# Patient Record
Sex: Female | Born: 1985 | Race: White | Hispanic: No | Marital: Single | Smoking: Never smoker
Health system: Southern US, Community
[De-identification: ages and names within clinical notes are randomized; demographics above are authoritative.]

## PROBLEM LIST (undated history)

## (undated) DIAGNOSIS — F99 Mental disorder, not otherwise specified: Secondary | ICD-10-CM

## (undated) DIAGNOSIS — F79 Unspecified intellectual disabilities: Secondary | ICD-10-CM

## (undated) HISTORY — PX: COSMETIC SURGERY: SHX468

---

## 2005-05-21 ENCOUNTER — Ambulatory Visit (HOSPITAL_COMMUNITY): Admission: RE | Admit: 2005-05-21 | Discharge: 2005-05-21 | Payer: Self-pay | Admitting: *Deleted

## 2011-05-08 ENCOUNTER — Emergency Department (HOSPITAL_COMMUNITY)
Admission: EM | Admit: 2011-05-08 | Discharge: 2011-05-08 | Disposition: A | Discharge status: Home / Self Care | Payer: Self-pay

## 2012-01-23 ENCOUNTER — Emergency Department (HOSPITAL_COMMUNITY): Payer: 59

## 2012-01-23 ENCOUNTER — Encounter (HOSPITAL_COMMUNITY): Payer: Self-pay | Admitting: *Deleted

## 2012-01-23 ENCOUNTER — Emergency Department (HOSPITAL_COMMUNITY)
Admission: EM | Admit: 2012-01-23 | Discharge: 2012-01-23 | Disposition: A | Discharge status: Home / Self Care | Payer: 59 | Attending: Emergency Medicine | Admitting: Emergency Medicine

## 2012-01-23 DIAGNOSIS — K8020 Calculus of gallbladder without cholecystitis without obstruction: Secondary | ICD-10-CM | POA: Insufficient documentation

## 2012-01-23 DIAGNOSIS — R109 Unspecified abdominal pain: Secondary | ICD-10-CM | POA: Insufficient documentation

## 2012-01-23 DIAGNOSIS — R079 Chest pain, unspecified: Secondary | ICD-10-CM | POA: Insufficient documentation

## 2012-01-23 HISTORY — DX: Mental disorder, not otherwise specified: F99

## 2012-01-23 LAB — CBC
HCT: 43.7 % (ref 36.0–46.0)
Hemoglobin: 14 g/dL (ref 12.0–15.0)
MCV: 90.5 fL (ref 78.0–100.0)
Platelets: 364 10*3/uL (ref 150–400)
RBC: 4.83 MIL/uL (ref 3.87–5.11)
WBC: 9.3 10*3/uL (ref 4.0–10.5)

## 2012-01-23 LAB — COMPREHENSIVE METABOLIC PANEL
ALT: 16 U/L (ref 0–35)
Albumin: 3.7 g/dL (ref 3.5–5.2)
CO2: 26 mEq/L (ref 19–32)
Calcium: 9.9 mg/dL (ref 8.4–10.5)
GFR calc Af Amer: >90 mL/min (ref >90)

## 2012-01-23 LAB — DIFFERENTIAL
Basophils Absolute: 0 10*3/uL (ref 0.0–0.1)
Basophils Relative: 0 % (ref 0–1)
Eosinophils Absolute: 0.9 10*3/uL — ABNORMAL HIGH (ref 0.0–0.7)
Lymphocytes Relative: 29 % (ref 12–46)
Lymphs Abs: 2.7 10*3/uL (ref 0.7–4.0)
Monocytes Absolute: 0.7 10*3/uL (ref 0.1–1.0)
Monocytes Relative: 8 % (ref 3–12)

## 2012-01-23 MED ORDER — IOHEXOL 300 MG/ML  SOLN
100.0000 mL | Freq: Once | INTRAMUSCULAR | Status: AC | PRN
  Administered 2012-01-23: 100 mL via INTRAVENOUS

## 2012-01-23 MED ORDER — GI COCKTAIL ~~LOC~~
30.0000 mL | Freq: Once | ORAL | Status: AC
  Administered 2012-01-23: 30 mL via ORAL
  Filled 2012-01-23: qty 30

## 2012-01-23 MED ORDER — MORPHINE SULFATE 4 MG/ML IJ SOLN
4.0000 mg | Freq: Once | INTRAMUSCULAR | Status: AC
  Administered 2012-01-23: 4 mg via INTRAVENOUS
  Filled 2012-01-23: qty 1

## 2012-01-23 MED ORDER — IOHEXOL 300 MG/ML  SOLN
40.0000 mL | Freq: Once | INTRAMUSCULAR | Status: AC | PRN
  Administered 2012-01-23: 40 mL via ORAL

## 2012-01-23 NOTE — ED Notes (Signed)
Dad leaving at this time to go home, will return in 10 minutes.

## 2012-01-23 NOTE — Discharge Instructions (Signed)
Make an appointment with the surgeon to discuss whether her gallbladder should be removed on an elective basis. Return to the ED if her pain is getting worse.  Abdominal Pain Abdominal pain can be caused by many things. Your caregiver decides the seriousness of your pain by an examination and possibly blood tests and X-rays. Many cases can be observed and treated at home. Most abdominal pain is not caused by a disease and will probably improve without treatment. However, in many cases, more time must pass before a clear cause of the pain can be found. Before that point, it may not be known if you need more testing, or if hospitalization or surgery is needed. HOME CARE INSTRUCTIONS   Do not take laxatives unless directed by your caregiver.   Take pain medicine only as directed by your caregiver.   Only take over-the-counter or prescription medicines for pain, discomfort, or fever as directed by your caregiver.   Try a clear liquid diet (broth, tea, or water) for as long as directed by your caregiver. Slowly move to a bland diet as tolerated.  SEEK IMMEDIATE MEDICAL CARE IF:   The pain does not go away.   You have a fever.   You keep throwing up (vomiting).   The pain is felt only in portions of the abdomen. Pain in the right side could possibly be appendicitis. In an adult, pain in the left lower portion of the abdomen could be colitis or diverticulitis.   You pass bloody or black tarry stools.  MAKE SURE YOU:   Understand these instructions.   Will watch your condition.   Will get help right away if you are not doing well or get worse.  Document Released: 06/02/2005 Document Revised: 08/12/2011 Document Reviewed: 04/10/2008 [facility] Patient Information 2012 [place], [place].  Cholelithiasis Cholelithiasis (also called gallstones) is a form of gallbladder disease where gallstones form in your gallbladder. The gallbladder is a non-essential organ that stores bile made in the liver,  which helps digest fats. Gallstones begin as small crystals and slowly grow into stones. Gallstone pain occurs when the gallbladder spasms, and a gallstone is blocking the duct. Pain can also occur when a stone passes out of the duct.  Women are more likely to develop gallstones than men. Other factors that increase the risk of gallbladder disease are:  Having multiple pregnancies. Physicians sometimes advise removing diseased gallbladders before future pregnancies.   Obesity.   Diets heavy in fried foods and fat.   Increasing age (older than 40).   Prolonged use of medications containing female hormones.   Diabetes mellitus.   Rapid weight loss.   Family history of gallstones (heredity).  SYMPTOMS  Feeling sick to your stomach (nauseous).   Abdominal pain.   Yellowing of the skin (jaundice).   Sudden pain. It may persist from several minutes to several hours.   Worsening pain with deep breathing or when jarred.   Fever.   Tenderness to the touch.  In some cases, when gallstones do not move into the bile duct, people have no pain or symptoms. These are called "silent" gallstones. TREATMENT In severe cases, emergency surgery may be required. HOME CARE INSTRUCTIONS   Only take over-the-counter or prescription medicines for pain, discomfort, or fever as directed by your caregiver.   Follow a low-fat diet until seen again. Fat causes the gallbladder to contract, which can result in pain.   Follow up as instructed. Attacks are almost always recurrent and surgery is usually required  for permanent treatment.  SEEK IMMEDIATE MEDICAL CARE IF:   Your pain increases and is not controlled by medications.   You have an oral temperature above 102 F (38.9 C), not controlled by medication.   You develop nausea and vomiting.  MAKE SURE YOU:   Understand these instructions.   Will watch your condition.   Will get help right away if you are not doing well or get worse.    Document Released: 08/19/2005 Document Revised: 08/12/2011 Document Reviewed: 10/22/2010 [facility] Patient Information 2012 [place], [place].

## 2012-01-23 NOTE — ED Notes (Signed)
Pt completed one bottle of contrast. Assisted by father, ct-tech notified.

## 2012-01-23 NOTE — ED Notes (Signed)
Pt drinking contrast, assisted by father

## 2012-01-23 NOTE — ED Notes (Signed)
Caregiver states pt not eating as much as normal. Pt complaining of stomach hurts & moving up to her chest. Pt was given pepto PTA w/ some relief.

## 2012-01-23 NOTE — ED Notes (Signed)
Pt complaining of upper abdominal pain.

## 2012-01-23 NOTE — ED Provider Notes (Addendum)
History     CSN: [id number]  Arrival date & time 01/23/12  0459   First MD Initiated Contact with Patient 01/23/12 0500      Chief Complaint  Patient presents with  . Abdominal Pain    (Consider location/radiation/quality/duration/timing/severity/associated sxs/prior treatment) HPI Comments: Pt is a 26 y/o female with special needs according to caregiver who has no surgical history, presents with lower chest pain adn upper abd pain that started yesterday, was intermittent, gradually worsening, associated with decreased appetite but no vomiting, fever, diarrhea, rashes or other c/o.  She is not fothcoming with information as she has some "special needs".  Sx are persistent.  Patient is a 26 y.o. female presenting with abdominal pain. The history is provided by the patient and a parent.  Abdominal Pain The primary symptoms of the illness include abdominal pain.    Past Medical History  Diagnosis Date  . Mental disorder     Past Surgical History  Procedure Date  . Cosmetic surgery     No family history on file.  History  Substance Use Topics  . Smoking status: Never Smoker   . Smokeless tobacco: Not on file  . Alcohol Use: No    OB History    Grav Para Term Preterm Abortions TAB SAB Ect Mult Living                  Review of Systems  Gastrointestinal: Positive for abdominal pain.  All other systems reviewed and are negative.    Allergies  Review of patient's allergies indicates no known allergies.  Home Medications  No current outpatient prescriptions on file.  BP 146/75  Pulse 70  Temp(Src) 97.6 F (36.4 C) (Oral)  Resp 20  Ht 5\' 6"  (1.676 m)  Wt 150 lb (68.04 kg)  BMI 24.21 kg/m2  SpO2 100%  LMP 01/17/2012  Physical Exam  Nursing note and vitals reviewed. Constitutional: She appears well-developed and well-nourished.       Uncomfortable appearing  HENT:  Head: Normocephalic and atraumatic.  Mouth/Throat: Oropharynx is clear and moist. No  oropharyngeal exudate.  Eyes: Conjunctivae and EOM are normal. Pupils are equal, round, and reactive to light. Right eye exhibits no discharge. Left eye exhibits no discharge. No scleral icterus.  Neck: Normal range of motion. Neck supple. No JVD present. No thyromegaly present.  Cardiovascular: Normal rate, regular rhythm and intact distal pulses.  Exam reveals no gallop and no friction rub.   Murmur ( soft systolic) heard. Pulmonary/Chest: Effort normal and breath sounds normal. No respiratory distress. She has no wheezes. She has no rales.  Abdominal: Soft. Bowel sounds are normal. She exhibits no distension and no mass. There is tenderness ( epigastric, RUQ and mid abd ttp with some guarding, no lower abd ttp, non peritoneal).  Musculoskeletal: Normal range of motion. She exhibits no edema and no tenderness.  Lymphadenopathy:    She has no cervical adenopathy.  Neurological: She is alert. Coordination normal.       Ambulates without difficulty  Skin: Skin is warm and dry. No rash noted. No erythema.  Psychiatric: She has a normal mood and affect. Her behavior is normal.    ED Course  Procedures (including critical care time)  ED ECG REPORT   Date: 01/23/2012   Rate: 64  Rhythm: normal sinus rhythm  QRS Axis: normal  Intervals: normal  ST/T Wave abnormalities: normal  Conduction Disutrbances:nonspecific intraventricular conduction delay  Narrative Interpretation:   Old EKG Reviewed: none available  Labs Reviewed  CBC  DIFFERENTIAL  LIPASE, BLOOD  COMPREHENSIVE METABOLIC PANEL  URINALYSIS, WITH MICROSCOPIC   No results found.   No diagnosis found.    MDM  Pt seen and examined - evaluate with labs, pain medicines, imaging with Acute abd series.  Vs nml, no fever, tachycardia.  Has both upper abd and lower CP - ECG to r/o pericarditis.  Pt has improved after medicines, has been given GI cocktail and Morphine, no vomiting, drinking contrast at this time.  CT pending.   Change of shift - care signed out to Dr. Bobette Mo, MD 01/23/12 [phone]  CT scan shows evidence of cholelithiasis. It iss not clear whether gallstones are responsible for her pain. However, given her limited ability to communicate, I think she should be considered for elective cholecystectomy. I discussed this with the patient's father.  Results for orders placed during the hospital encounter of 01/23/12  CBC      Component Value Range   WBC 9.3  4.0 - 10.5 (K/uL)   RBC 4.83  3.87 - 5.11 (MIL/uL)   Hemoglobin 14.0  12.0 - 15.0 (g/dL)   HCT 11.9  14.7 - 82.9 (%)   MCV 90.5  78.0 - 100.0 (fL)   MCH 29.0  26.0 - 34.0 (pg)   MCHC 32.0  30.0 - 36.0 (g/dL)   RDW 56.2  13.0 - 86.5 (%)   Platelets 364  150 - 400 (K/uL)  DIFFERENTIAL      Component Value Range   Neutrophils Relative 53  43 - 77 (%)   Neutro Abs 5.0  1.7 - 7.7 (K/uL)   Lymphocytes Relative 29  12 - 46 (%)   Lymphs Abs 2.7  0.7 - 4.0 (K/uL)   Monocytes Relative 8  3 - 12 (%)   Monocytes Absolute 0.7  0.1 - 1.0 (K/uL)   Eosinophils Relative 10 (*) 0 - 5 (%)   Eosinophils Absolute 0.9 (*) 0.0 - 0.7 (K/uL)   Basophils Relative 0  0 - 1 (%)   Basophils Absolute 0.0  0.0 - 0.1 (K/uL)  LIPASE, BLOOD      Component Value Range   Lipase 26  11 - 59 (U/L)  COMPREHENSIVE METABOLIC PANEL      Component Value Range   Sodium 140  135 - 145 (mEq/L)   Potassium 3.0 (*) 3.5 - 5.1 (mEq/L)   Chloride 101  96 - 112 (mEq/L)   CO2 26  19 - 32 (mEq/L)   Glucose, Bld 141 (*) 70 - 99 (mg/dL)   BUN 8  6 - 23 (mg/dL)   Creatinine, Ser 7.84  0.50 - 1.10 (mg/dL)   Calcium 9.9  8.4 - 69.6 (mg/dL)   Total Protein 7.5  6.0 - 8.3 (g/dL)   Albumin 3.7  3.5 - 5.2 (g/dL)   AST 18  0 - 37 (U/L)   ALT 16  0 - 35 (U/L)   Alkaline Phosphatase 101  39 - 117 (U/L)   Total Bilirubin 0.2 (*) 0.3 - 1.2 (mg/dL)   GFR calc non Af Amer >90  >90 (mL/min)   GFR calc Af Amer >90  >90 (mL/min)   Ct Abdomen Pelvis W Contrast  01/23/2012   *RADIOLOGY REPORT*  Clinical Data: Mid upper abdominal pain  CT ABDOMEN AND PELVIS WITH CONTRAST  Technique:  Multidetector CT imaging of the abdomen and pelvis was performed following the standard protocol during bolus administration of intravenous contrast.  Contrast: OMNIPAQUE IOHEXOL 300 MG/ML  SOLN  Comparison: Abdominal radiographic series - 01/23/2012  Findings:  Normal hepatic contour.  No discrete hepatic lesions.  Multiple gallstones are seen within an otherwise normal-appearing gallbladder.  No definite gallbladder wall thickening or pericholecystic fluid.  No definite intra or extrahepatic biliary duct dilatation.  No ascites.  There is symmetric enhancement of the bilateral kidneys.  No urinary obstruction.  No discrete renal lesions.  No definite renal stones on the post contrast examination.  Normal appearance of the bilateral adrenal glands, pancreas and spleen.  Ingested enteric contrast extends to the level of the distal small bowel.  No evidence of enteric obstruction.  The sigmoid colon is noted to be slightly redundant.  Several diverticuli are noted within the transverse colon.  No evidence of diverticulitis. Normal appearance of the appendix.  No pneumoperitoneum, pneumatosis or portal venous gas.  Normal caliber of the abdominal aorta.  The major branch vessels of the abdominal aorta are patent.  Scattered shoddy mesenteric lymph nodes including dominant mesenteric node in the right lower quadrant measuring 8 mm in short axis diameter (image 47, series 2) not enlarged by CT criteria and likely reactive in etiology.  No retroperitoneal, pelvic or inguinal lymphadenopathy.  Pelvic organs are normal for age.  Minimal amount of fluid within the endometrial canal, likely physiologic.  No free fluid within the pelvis.  Limited visualization of the lower thorax demonstrates minimal basilar ground-glass opacities, right greater than left, favored to represent atelectasis.  No discrete focal  airspace opacities.  No pleural effusion.  Normal heart size.  No pericardial effusion.  No acute or aggressive osseous abnormalities.  IMPRESSION: 1.  Cholelithiasis without definite evidence of cholecystitis. Further evaluation may be obtained with a right upper quadrant abominal ultrasound as clinically indicated.  Otherwise, no explanation for patient's abdominal pain.  2.  Colonic diverticulosis without evidence of diverticulitis.  Original Report Authenticated By: Waynard Reeds, M.D.   Dg Abd Acute W/chest  01/23/2012  *RADIOLOGY REPORT*  Clinical Data: Upper abdominal pain  ACUTE ABDOMEN SERIES (ABDOMEN 2 VIEW & CHEST 1 VIEW)  Comparison: None.  Findings: Lungs are clear.  Cardiomediastinal contours are within normal limits.  Nonobstructive bowel gas pattern.  Organ outlines normal where seen.  No free intraperitoneal air.  No acute osseous finding.  IMPRESSION: Nonobstructive bowel gas pattern.  Original Report Authenticated By: Waneta Martins, M.D.      Dione Booze, MD 01/23/12 [id number]

## 2012-02-01 NOTE — H&P (Signed)
  NTS SOAP Note  Vital Signs:  Vitals as of: 02/01/2012: Systolic 121: Diastolic 73: Heart Rate 65: Temp 29F: Height 59ft 3.5in: Weight 162Lbs 0 Ounces: Pain Level 8: BMI 28  BMI : 28.25 kg/m2  Subjective: This 26 Years 3 Months old Female presents for of    ABDOMINAL PAIN : ,Has been having increasing upper abdominal pain over the past few weeks.  No nausea, vomiting, fever, chills, jaundice.  History obtained from mother as patient has mental deficiency.  Seen in ER, found to have cholelithiasis, diverticulosis.  Review of Symptoms:  Constitutional:unremarkable   Head:unremarkable    Eyes:unremarkable   Nose/Mouth/Throat:unremarkable Cardiovascular:  unremarkable   Respiratory:unremarkable   Gastrointestinal:  see above   Genitourinary:unremarkable     Musculoskeletal:unremarkable   Skin:unremarkable Hematolgic/Lymphatic:unremarkable     Allergic/Immunologic:unremarkable     Past Medical History:    Reviewed   Past Medical History  Surgical History: none Medical Problems: mental slowness Allergies: nkda  Medications: none   Social History:Reviewed  Social History  Preferred Language: English ([place]) Race:  White Ethnicity: Not Hispanic / Latino Age: 26 Years 5 Months Marital Status:  S Alcohol:  No Recreational drug(s):  No   Smoking Status: Never smoker reviewed on 02/01/2012  Family History:  Reviewed   Family History              Father: HTN    Objective Information: General:unremarkable   Head:Atraumatic; no masses; no abnormalities   No scleral icterus Heart:  RRR, no murmur Lungs:    CTA bilaterally, no wheezes, rhonchi, rales.  Breathing unlabored. Abdomen:Soft, slightly tender to palpation right upper quadrant, ND, no HSM, no masses.  Assessment:Biliary colic, cholelithiasis  Diagnosis &amp; Procedure: DiagnosisCode: 574.20, ProcedureCode: 16109,    Plan:i  discussed situation with mother.  She believes it is the gallbladder as she had similar symptoms and had hers removed.  Scheduled for laparoscopic cholecystectomy on 02/04/12.  She understands that this may or may not help her, but will help with any diagnostic workup in the future given patient is a poor historian.   Patient Education:Alternative treatments to surgery were discussed with patient (and family).  Risks and benefits  of procedure were fully explained to the patient (and family) who gave informed consent. Patient/family questions were addressed.  Follow-up:Pending Surgery

## 2012-02-02 ENCOUNTER — Encounter (HOSPITAL_COMMUNITY)
Admission: RE | Admit: 2012-02-02 | Discharge: 2012-02-02 | Disposition: A | Discharge status: Home / Self Care | Payer: 59 | Source: Ambulatory Visit | Attending: General Surgery | Admitting: General Surgery

## 2012-02-02 ENCOUNTER — Encounter (HOSPITAL_COMMUNITY): Payer: Self-pay

## 2012-02-02 ENCOUNTER — Encounter (HOSPITAL_COMMUNITY): Payer: Self-pay | Admitting: Pharmacy Technician

## 2012-02-02 LAB — BASIC METABOLIC PANEL
BUN: 11 mg/dL (ref 6–23)
CO2: 26 mEq/L (ref 19–32)
Chloride: 108 mEq/L (ref 96–112)
Creatinine, Ser: 0.68 mg/dL (ref 0.50–1.10)
GFR calc non Af Amer: >90 mL/min (ref >90)

## 2012-02-02 LAB — HCG, SERUM, QUALITATIVE: Preg, Serum: NEGATIVE

## 2012-02-02 LAB — SURGICAL PCR SCREEN: Staphylococcus aureus: NEGATIVE

## 2012-02-02 NOTE — Patient Instructions (Addendum)
PATIENT INSTRUCTIONS POST-ANESTHESIA  IMMEDIATELY FOLLOWING SURGERY:  Do not drive or operate machinery for the first twenty four hours after surgery.  Do not make any important decisions for twenty four hours after surgery or while taking narcotic pain medications or sedatives.  If you develop intractable nausea and vomiting or a severe headache please notify your doctor immediately.  FOLLOW-UP:  Please make an appointment with your surgeon as instructed. You do not need to follow up with anesthesia unless specifically instructed to do so.  WOUND CARE INSTRUCTIONS (if applicable):  Keep a dry clean dressing on the anesthesia/puncture wound site if there is drainage.  Once the wound has quit draining you may leave it open to air.  Generally you should leave the bandage intact for twenty four hours unless there is drainage.  If the epidural site drains for more than 36-48 hours please call the anesthesia department.  QUESTIONS?:  Please feel free to call your physician or the hospital operator if you have any questions, and they will be happy to assist you.     20 Karina Rogers  02/02/2012   Your procedure is scheduled on:   02/04/2012   Report to [facility] at  615  AM.  Call this number if you have problems the morning of surgery: [phone]   Remember:   Do not eat food:After Midnight.  May have clear liquids:until Midnight .    Take these medicines the morning of surgery with A SIP OF WATER:  none   Do not wear jewelry, make-up or nail polish.  Do not wear lotions, powders, or perfumes. You may wear deodorant.  Do not shave 48 hours prior to surgery. Men may shave face and neck.  Do not bring valuables to the hospital.  Contacts, dentures or bridgework may not be worn into surgery.  Leave suitcase in the car. After surgery it may be brought to your room.  For patients admitted to the hospital, checkout time is 11:00 AM the day of discharge.   Patients discharged the day of surgery  will not be allowed to drive home.  Name and phone number of your driver: family  Special Instructions: CHG Shower Use Special Wash: 1/2 bottle night before surgery and 1/2 bottle morning of surgery.   Please read over the following fact sheets that you were given: Pain Booklet, MRSA Information, Surgical Site Infection Prevention, Anesthesia Post-op Instructions and Care and Recovery After Surgery Laparoscopic Cholecystectomy Laparoscopic cholecystectomy is surgery to remove the gallbladder. The gallbladder is located slightly to the right of center in the abdomen, behind the liver. It is a concentrating and storage sac for the bile produced in the liver. Bile aids in the digestion and absorption of fats. Gallbladder disease (cholecystitis) is an inflammation of your gallbladder. This condition is usually caused by a buildup of gallstones (cholelithiasis) in your gallbladder. Gallstones can block the flow of bile, resulting in inflammation and pain. In severe cases, emergency surgery may be required. When emergency surgery is not required, you will have time to prepare for the procedure. Laparoscopic surgery is an alternative to open surgery. Laparoscopic surgery usually has a shorter recovery time. Your common bile duct may also need to be examined and explored. Your caregiver will discuss this with you if he or she feels this should be done. If stones are found in the common bile duct, they may be removed. LET YOUR CAREGIVER KNOW ABOUT:  Allergies to food or medicine.   Medicines taken, including  vitamins, herbs, eyedrops, over-the-counter medicines, and creams.   Use of steroids (by mouth or creams).   Previous problems with anesthetics or numbing medicines.   History of bleeding problems or blood clots.   Previous surgery.   Other health problems, including diabetes and kidney problems.   Possibility of pregnancy, if this applies.  RISKS AND COMPLICATIONS All surgery is associated with  risks. Some problems that may occur following this procedure include:  Infection.   Damage to the common bile duct, nerves, arteries, veins, or other internal organs such as the stomach or intestines.   Bleeding.   A stone may remain in the common bile duct.  BEFORE THE PROCEDURE  Do not take aspirin for 3 days prior to surgery or blood thinners for 1 week prior to surgery.   Do not eat or drink anything after midnight the night before surgery.   Let your caregiver know if you develop a cold or other infectious problem prior to surgery.   You should be present 60 minutes before the procedure or as directed.  PROCEDURE  You will be given medicine that makes you sleep (general anesthetic). When you are asleep, your surgeon will make several small cuts (incisions) in your abdomen. One of these incisions is used to insert a small, lighted scope (laparoscope) into the abdomen. The laparoscope helps the surgeon see into your abdomen. Carbon dioxide gas will be pumped into your abdomen. The gas allows more room for the surgeon to perform your surgery. Other operating instruments are inserted through the other incisions. Laparoscopic procedures may not be appropriate when:  There is major scarring from previous surgery.   The gallbladder is extremely inflamed.   There are bleeding disorders or unexpected cirrhosis of the liver.   A pregnancy is near term.   Other conditions make the laparoscopic procedure impossible.  If your surgeon feels it is not safe to continue with a laparoscopic procedure, he or she will perform an open abdominal procedure. In this case, the surgeon will make an incision to open the abdomen. This gives the surgeon a larger view and field to work within. This may allow the surgeon to perform procedures that sometimes cannot be performed with a laparoscope alone. Open surgery has a longer recovery time. AFTER THE PROCEDURE  You will be taken to the recovery area where  a nurse will watch and check your progress.   You may be allowed to go home the same day.   Do not resume physical activities until directed by your caregiver.   You may resume a normal diet and activities as directed.  Document Released: 08/23/2005 Document Revised: 08/12/2011 Document Reviewed: 02/05/2011 [facility] Patient Information 2012 [place], [place].

## 2012-02-04 ENCOUNTER — Encounter (HOSPITAL_COMMUNITY)
Admission: RE | Disposition: A | Discharge status: Home / Self Care | Payer: Self-pay | Source: Ambulatory Visit | Attending: General Surgery

## 2012-02-04 ENCOUNTER — Encounter (HOSPITAL_COMMUNITY): Payer: Self-pay | Admitting: Anesthesiology

## 2012-02-04 ENCOUNTER — Ambulatory Visit (HOSPITAL_COMMUNITY): Payer: 59 | Admitting: Anesthesiology

## 2012-02-04 ENCOUNTER — Ambulatory Visit (HOSPITAL_COMMUNITY)
Admission: RE | Admit: 2012-02-04 | Discharge: 2012-02-04 | Disposition: A | Discharge status: Home / Self Care | Payer: 59 | Source: Ambulatory Visit | Attending: General Surgery | Admitting: General Surgery

## 2012-02-04 ENCOUNTER — Encounter (HOSPITAL_COMMUNITY): Payer: Self-pay | Admitting: *Deleted

## 2012-02-04 DIAGNOSIS — K8020 Calculus of gallbladder without cholecystitis without obstruction: Secondary | ICD-10-CM | POA: Insufficient documentation

## 2012-02-04 HISTORY — PX: CHOLECYSTECTOMY: SHX55

## 2012-02-04 SURGERY — LAPAROSCOPIC CHOLECYSTECTOMY
Anesthesia: General | Site: Abdomen | Wound class: Clean Contaminated

## 2012-02-04 MED ORDER — ROCURONIUM BROMIDE 50 MG/5ML IV SOLN
INTRAVENOUS | Status: AC
  Filled 2012-02-04: qty 1

## 2012-02-04 MED ORDER — LACTATED RINGERS IV SOLN
INTRAVENOUS | Status: DC
  Administered 2012-02-04 (×2): 1000 mL via INTRAVENOUS

## 2012-02-04 MED ORDER — SODIUM CHLORIDE 0.9 % IR SOLN
Status: DC | PRN
  Administered 2012-02-04: 1000 mL

## 2012-02-04 MED ORDER — GLYCOPYRROLATE 0.2 MG/ML IJ SOLN
INTRAMUSCULAR | Status: AC
  Filled 2012-02-04: qty 1

## 2012-02-04 MED ORDER — NEOSTIGMINE METHYLSULFATE 1 MG/ML IJ SOLN
INTRAMUSCULAR | Status: AC
  Filled 2012-02-04: qty 10

## 2012-02-04 MED ORDER — MIDAZOLAM HCL 2 MG/2ML IJ SOLN
1.0000 mg | INTRAMUSCULAR | Status: DC | PRN
  Administered 2012-02-04: 2 mg via INTRAVENOUS

## 2012-02-04 MED ORDER — MIDAZOLAM HCL 2 MG/2ML IJ SOLN
INTRAMUSCULAR | Status: AC
  Administered 2012-02-04: 2 mg via INTRAVENOUS
  Filled 2012-02-04: qty 2

## 2012-02-04 MED ORDER — ONDANSETRON HCL 4 MG/2ML IJ SOLN
INTRAMUSCULAR | Status: AC
  Administered 2012-02-04: 4 mg via INTRAVENOUS
  Filled 2012-02-04: qty 2

## 2012-02-04 MED ORDER — PROPOFOL 10 MG/ML IV EMUL
INTRAVENOUS | Status: DC | PRN
  Administered 2012-02-04: 150 mg via INTRAVENOUS

## 2012-02-04 MED ORDER — CEFAZOLIN SODIUM-DEXTROSE 2-3 GM-% IV SOLR
2.0000 g | INTRAVENOUS | Status: AC
  Administered 2012-02-04: 2 g via INTRAVENOUS

## 2012-02-04 MED ORDER — LACTATED RINGERS IV SOLN: INTRAVENOUS | Status: DC

## 2012-02-04 MED ORDER — NEOSTIGMINE METHYLSULFATE 1 MG/ML IJ SOLN
INTRAMUSCULAR | Status: DC | PRN
  Administered 2012-02-04: 2 mg via INTRAVENOUS

## 2012-02-04 MED ORDER — ROCURONIUM BROMIDE 100 MG/10ML IV SOLN
INTRAVENOUS | Status: DC | PRN
  Administered 2012-02-04: 25 mg via INTRAVENOUS
  Administered 2012-02-04: 5 mg via INTRAVENOUS

## 2012-02-04 MED ORDER — CHLORHEXIDINE GLUCONATE 4 % EX LIQD
1.0000 "application " | Freq: Once | CUTANEOUS | Status: DC
  Filled 2012-02-04: qty 15

## 2012-02-04 MED ORDER — KETOROLAC TROMETHAMINE 30 MG/ML IJ SOLN: 30.0000 mg | Freq: Once | INTRAMUSCULAR | Status: DC

## 2012-02-04 MED ORDER — LIDOCAINE HCL (PF) 1 % IJ SOLN
INTRAMUSCULAR | Status: AC
  Filled 2012-02-04: qty 5

## 2012-02-04 MED ORDER — PROPOFOL 10 MG/ML IV EMUL
INTRAVENOUS | Status: AC
  Filled 2012-02-04: qty 20

## 2012-02-04 MED ORDER — LIDOCAINE HCL (CARDIAC) 10 MG/ML IV SOLN
INTRAVENOUS | Status: DC | PRN
  Administered 2012-02-04: 10 mg via INTRAVENOUS

## 2012-02-04 MED ORDER — ACETAMINOPHEN 325 MG PO TABS: 325.0000 mg | ORAL_TABLET | ORAL | Status: DC | PRN

## 2012-02-04 MED ORDER — HEMOSTATIC AGENTS (NO CHARGE) OPTIME
TOPICAL | Status: DC | PRN
  Administered 2012-02-04: 1 via TOPICAL

## 2012-02-04 MED ORDER — GLYCOPYRROLATE 0.2 MG/ML IJ SOLN
INTRAMUSCULAR | Status: DC | PRN
  Administered 2012-02-04: 0.4 mg via INTRAVENOUS

## 2012-02-04 MED ORDER — GLYCOPYRROLATE 0.2 MG/ML IJ SOLN
0.2000 mg | Freq: Once | INTRAMUSCULAR | Status: AC
  Administered 2012-02-04: 0.2 mg via INTRAVENOUS

## 2012-02-04 MED ORDER — GLYCOPYRROLATE 0.2 MG/ML IJ SOLN
INTRAMUSCULAR | Status: AC
  Administered 2012-02-04: 0.2 mg via INTRAVENOUS
  Filled 2012-02-04: qty 1

## 2012-02-04 MED ORDER — BUPIVACAINE HCL (PF) 0.5 % IJ SOLN
INTRAMUSCULAR | Status: AC
  Filled 2012-02-04: qty 30

## 2012-02-04 MED ORDER — FENTANYL CITRATE 0.05 MG/ML IJ SOLN
INTRAMUSCULAR | Status: DC | PRN
  Administered 2012-02-04: 50 ug via INTRAVENOUS
  Administered 2012-02-04: 25 ug via INTRAVENOUS
  Administered 2012-02-04: 50 ug via INTRAVENOUS
  Administered 2012-02-04: 25 ug via INTRAVENOUS
  Administered 2012-02-04: 50 ug via INTRAVENOUS

## 2012-02-04 MED ORDER — CEFAZOLIN SODIUM-DEXTROSE 2-3 GM-% IV SOLR
INTRAVENOUS | Status: AC
  Filled 2012-02-04: qty 50

## 2012-02-04 MED ORDER — ONDANSETRON HCL 4 MG/2ML IJ SOLN: 4.0000 mg | Freq: Once | INTRAMUSCULAR | Status: DC | PRN

## 2012-02-04 MED ORDER — BUPIVACAINE HCL 0.5 % IJ SOLN
INTRAMUSCULAR | Status: DC | PRN
  Administered 2012-02-04: 10 mL

## 2012-02-04 MED ORDER — FENTANYL CITRATE 0.05 MG/ML IJ SOLN
INTRAMUSCULAR | Status: AC
  Administered 2012-02-04: 50 ug via INTRAVENOUS
  Filled 2012-02-04: qty 2

## 2012-02-04 MED ORDER — ONDANSETRON HCL 4 MG/2ML IJ SOLN
4.0000 mg | Freq: Once | INTRAMUSCULAR | Status: AC
  Administered 2012-02-04: 4 mg via INTRAVENOUS

## 2012-02-04 MED ORDER — HYDROCODONE-ACETAMINOPHEN 5-325 MG PO TABS
1.0000 | ORAL_TABLET | Freq: Four times a day (QID) | ORAL | Status: AC | PRN
Start: 2012-02-04 — End: 2012-02-14

## 2012-02-04 MED ORDER — FENTANYL CITRATE 0.05 MG/ML IJ SOLN
INTRAMUSCULAR | Status: AC
  Filled 2012-02-04: qty 5

## 2012-02-04 MED ORDER — FENTANYL CITRATE 0.05 MG/ML IJ SOLN
25.0000 ug | INTRAMUSCULAR | Status: DC | PRN
  Administered 2012-02-04 (×2): 50 ug via INTRAVENOUS

## 2012-02-04 SURGICAL SUPPLY — 37 items
ADH SKN CLS APL DERMABOND .7 (GAUZE/BANDAGES/DRESSINGS) ×1
APPLIER CLIP ROT 10 11.4 M/L (STAPLE) ×2
APR CLP MED LRG 11.4X10 (STAPLE) ×1
BAG HAMPER (MISCELLANEOUS) ×2 IMPLANT
BAG SPEC RTRVL LRG 6X4 10 (ENDOMECHANICALS) ×1
CLIP APPLIE ROT 10 11.4 M/L (STAPLE) ×1 IMPLANT
CLOTH BEACON ORANGE TIMEOUT ST (SAFETY) ×2 IMPLANT
COVER LIGHT HANDLE STERIS (MISCELLANEOUS) ×4 IMPLANT
DECANTER SPIKE VIAL GLASS SM (MISCELLANEOUS) ×2 IMPLANT
DERMABOND ADVANCED (GAUZE/BANDAGES/DRESSINGS) ×1
DERMABOND ADVANCED .7 DNX12 (GAUZE/BANDAGES/DRESSINGS) IMPLANT
DURAPREP 26ML APPLICATOR (WOUND CARE) ×2 IMPLANT
ELECT REM PT RETURN 9FT ADLT (ELECTROSURGICAL) ×2
ELECTRODE REM PT RTRN 9FT ADLT (ELECTROSURGICAL) ×1 IMPLANT
FILTER SMOKE EVAC LAPAROSHD (FILTER) ×2 IMPLANT
FORMALIN 10 PREFIL 120ML (MISCELLANEOUS) ×2 IMPLANT
GLOVE BIO SURGEON STRL SZ7.5 (GLOVE) ×2 IMPLANT
GLOVE BIOGEL PI IND STRL 7.0 (GLOVE) IMPLANT
GLOVE BIOGEL PI INDICATOR 7.0 (GLOVE) ×2
GLOVE ECLIPSE 6.5 STRL STRAW (GLOVE) ×2 IMPLANT
GOWN STRL REIN XL XLG (GOWN DISPOSABLE) ×3 IMPLANT
HEMOSTAT SNOW SURGICEL 2X4 (HEMOSTASIS) ×1 IMPLANT
INST SET LAPROSCOPIC AP (KITS) ×2 IMPLANT
KIT ROOM TURNOVER APOR (KITS) ×2 IMPLANT
KIT TROCAR LAP CHOLE (TROCAR) ×2 IMPLANT
MANIFOLD NEPTUNE II (INSTRUMENTS) ×2 IMPLANT
NS IRRIG 1000ML POUR BTL (IV SOLUTION) ×2 IMPLANT
PACK LAP CHOLE LZT030E (CUSTOM PROCEDURE TRAY) ×2 IMPLANT
PAD ARMBOARD 7.5X6 YLW CONV (MISCELLANEOUS) ×2 IMPLANT
POUCH SPECIMEN RETRIEVAL 10MM (ENDOMECHANICALS) ×2 IMPLANT
SET BASIN LINEN APH (SET/KITS/TRAYS/PACK) ×2 IMPLANT
SPONGE GAUZE 2X2 8PLY STRL LF (GAUZE/BANDAGES/DRESSINGS) ×4 IMPLANT
STAPLER VISISTAT (STAPLE) ×1 IMPLANT
SUT VIC AB 4-0 PS2 27 (SUTURE) ×2 IMPLANT
SUT VICRYL 0 UR6 27IN ABS (SUTURE) ×2 IMPLANT
WARMER LAPAROSCOPE (MISCELLANEOUS) ×2 IMPLANT
YANKAUER SUCT 12FT TUBE ARGYLE (SUCTIONS) ×2 IMPLANT

## 2012-02-04 NOTE — Interval H&P Note (Signed)
History and Physical Interval Note:  02/04/2012 7:16 AM  Karina Rogers  has presented today for surgery, with the diagnosis of cholecystitis, cholelithiasis  The various methods of treatment have been discussed with the patient and family. After consideration of risks, benefits and other options for treatment, the patient has consented to  Procedure(s) (LRB): LAPAROSCOPIC CHOLECYSTECTOMY (N/A) as a surgical intervention .  The patients' history has been reviewed, patient examined, no change in status, stable for surgery.  I have reviewed the patients' chart and labs.  Questions were answered to the patient's satisfaction.     Franky Macho A

## 2012-02-04 NOTE — Anesthesia Postprocedure Evaluation (Signed)
Anesthesia Post Note  Patient: Karina Rogers  Procedure(s) Performed: Procedure(s) (LRB): LAPAROSCOPIC CHOLECYSTECTOMY (N/A)  Anesthesia type: General  Patient location: PACU  Post pain: Pain level controlled  Post assessment: Post-op Vital signs reviewed, Patient's Cardiovascular Status Stable, Respiratory Function Stable, Patent Airway, No signs of Nausea or vomiting and Pain level controlled  Last Vitals:  Filed Vitals:   02/04/12 0856  BP: 133/68  Pulse: 107  Temp: 36.5 C  Resp: 12    Post vital signs: Reviewed and stable  Level of consciousness: awake and alert   Complications: No apparent anesthesia complications

## 2012-02-04 NOTE — Anesthesia Preprocedure Evaluation (Addendum)
Anesthesia Evaluation  Patient identified by MRN, date of birth, ID band Patient awake    Reviewed: Allergy & Precautions, H&P , NPO status , Patient's Chart, lab work & pertinent test results  Airway Mallampati: I TM Distance: >3 FB Neck ROM: Full    Dental No notable dental hx.    Pulmonary neg pulmonary ROS,          Cardiovascular negative cardio ROS  Rate:Normal     Neuro/Psych PSYCHIATRIC DISORDERS negative neurological ROS     GI/Hepatic negative GI ROS, Neg liver ROS,   Endo/Other  negative endocrine ROS  Renal/GU negative Renal ROS     Musculoskeletal negative musculoskeletal ROS (+)   Abdominal   Peds  Hematology negative hematology ROS (+)   Anesthesia Other Findings   Reproductive/Obstetrics negative OB ROS                          Anesthesia Physical Anesthesia Plan  ASA: II  Anesthesia Plan: General   Post-op Pain Management:    Induction: Intravenous  Airway Management Planned: Oral ETT  Additional Equipment:   Intra-op Plan:   Post-operative Plan: Extubation in OR  Informed Consent: I have reviewed the patients History and Physical, chart, labs and discussed the procedure including the risks, benefits and alternatives for the proposed anesthesia with the patient or authorized representative who has indicated his/her understanding and acceptance.   Dental advisory given  Plan Discussed with: CRNA  Anesthesia Plan Comments:         Anesthesia Quick Evaluation

## 2012-02-04 NOTE — Transfer of Care (Signed)
Immediate Anesthesia Transfer of Care Note  Patient: Karina Rogers  Procedure(s) Performed: Procedure(s) (LRB): LAPAROSCOPIC CHOLECYSTECTOMY (N/A)  Patient Location: PACU  Anesthesia Type: General  Level of Consciousness: awake  Airway & Oxygen Therapy: Patient Spontanous Breathing and non-rebreather face mask  Post-op Assessment: Report given to PACU RN, Post -op Vital signs reviewed and stable and Patient moving all extremities  Post vital signs: Reviewed and stable  Complications: No apparent anesthesia complications

## 2012-02-04 NOTE — Op Note (Signed)
Patient:  Karina Rogers  DOB:  Dec 29, 1985  MRN:  [id number]   Preop Diagnosis:  Biliary colic, cholelithiasis  Postop Diagnosis:  Same  Procedure:  Laparoscopic cholecystectomy  Surgeon:  Franky Macho, M.D.  Anes:  General endotracheal  Indications:  Patient is a 26 year old white female presents with biliary colic secondary to cholelithiasis. The risks and benefits of the procedure including bleeding, infection, hepatobiliary injury, the possibly of an open procedure were fully explained to the patient's parents, gave informed consent the patient as the patient is mentally deficient.  Procedure note:  Patient is placed the supine position. After induction of general endotracheal anesthesia, the abdomen was prepped and draped using usual sterile technique with DuraPrep. Surgical site confirmation was performed.  A supraumbilical incision was made down to the fascia. A Veress needle was introduced into the abdominal cavity and confirmation of placement was done using the saline drop test. The abdomen was then insufflated to 16 mm mercury pressure. An 11 mm trocar was introduced into the abdominal cavity under direct visualization the difficulty. The patient was placed in reverse Trendelenburg position additional 11 mm trocar was placed the epigastric region and 5 mm trochars were placed the right upper quadrant and right flank regions. The liver was inspected and noted to be within normal limits. Gallbladder was retracted superiorly and laterally. Dissection was begun around the infundibulum of the gallbladder. The cystic duct was first identified. Its junction to the infundibulum was fully identified. Endoclips were placed proximally distally on the cystic duct and cystic duct was divided. This is likewise done cystic artery. The gallbladder was then freed away from the gallbladder fossa using Bovie electrocautery. The gallbladder was delivered through the epigastric trocar site using an Endo  Catch bag. The gallbladder fossa was inspected and no abnormal bleeding or bile leakage was noted. Surgicel is placed the gallbladder fossa. All fluid and air were then evacuated from the abdominal cavity prior to removal of the trochars.  All wounds were gave normal saline. All wounds were injected with 0.5% Sensorcaine. The supraumbilical fascia was reapproximated using an 0 Vicryl interrupted suture. All skin incisions were closed using a 4-0 Vicryl subcuticular suture. Dermabond was applied.  All tape and needle counts were correct at the end of the procedure. Patient was extubated in the operating room and went back recovery room awake in stable condition.  Complications:  None  EBL:  Minimal  Specimen:  Gallbladder

## 2012-02-04 NOTE — Discharge Instructions (Signed)
Laparoscopic Cholecystectomy Care After Refer to this sheet in the next few weeks. These instructions provide you with information on caring for yourself after your procedure. Your caregiver may also give you more specific instructions. Your treatment has been planned according to current medical practices, but problems sometimes occur. Call your caregiver if you have any problems or questions after your procedure. HOME CARE INSTRUCTIONS   Change bandages (dressings) as directed by your caregiver.   Keep the wound dry and clean. The wound may be washed gently with soap and water. Gently blot or dab the area dry.   Do not take baths or use swimming pools or hot tubs for 10 days, or as instructed by your caregiver.   Only take over-the-counter or prescription medicines for pain, discomfort, or fever as directed by your caregiver.   Continue your normal diet as directed by your caregiver.   Do not lift anything heavier than 25 pounds (11.5 kg), or as directed by your caregiver.   Do not play contact sports for 1 week, or as directed by your caregiver.  SEEK MEDICAL CARE IF:   There is redness, swelling, or increasing pain in the wound.   You notice yellowish-white fluid (pus) coming from the wound.   There is drainage from the wound that lasts longer than 1 day.   There is a bad smell coming from the wound or dressing.   The surgical cut (incision) breaks open.  SEEK IMMEDIATE MEDICAL CARE IF:   You develop a rash.   You have difficulty breathing.   You develop chest pain.   You develop any reaction or side effects to medicines given.   You have a fever.   You have increasing pain in the shoulders (shoulder strap areas).   You have dizzy episodes or faint while standing.   You develop severe abdominal pain.   You feel sick to your stomach (nauseous) or throw up (vomit) and this lasts for more than 1 day.  MAKE SURE YOU:   Understand these instructions.   Will watch  your condition.   Will get help right away if you are not doing well or get worse.  Document Released: 08/23/2005 Document Revised: 08/12/2011 Document Reviewed: 02/05/2011 [employer] Patient Information 2012 [employer]. 

## 2012-02-04 NOTE — Anesthesia Procedure Notes (Signed)
Procedure Name: Intubation Date/Time: 02/04/2012 8:02 AM Performed by: Franco Nones Pre-anesthesia Checklist: Patient identified, Patient being monitored, Timeout performed, Emergency Drugs available and Suction available Patient Re-evaluated:Patient Re-evaluated prior to inductionOxygen Delivery Method: Circle System Utilized Preoxygenation: Pre-oxygenation with 100% oxygen Intubation Type: IV induction Ventilation: Mask ventilation without difficulty Laryngoscope Size: Miller and 2 Grade View: Grade I Tube type: Oral Tube size: 7.0 mm Number of attempts: 1 Airway Equipment and Method: stylet Placement Confirmation: ETT inserted through vocal cords under direct vision,  positive ETCO2 and breath sounds checked- equal and bilateral Secured at: 21 cm Tube secured with: Tape Dental Injury: Teeth and Oropharynx as per pre-operative assessment

## 2012-02-09 ENCOUNTER — Encounter (HOSPITAL_COMMUNITY): Payer: Self-pay | Admitting: General Surgery

## 2013-07-02 IMAGING — CR DG ABDOMEN ACUTE W/ 1V CHEST
4 series · 4 of 4 positions shown · non-contrast
Comparison: None.

CLINICAL DATA: Upper abdominal pain

ACUTE ABDOMEN SERIES (ABDOMEN 2 VIEW & CHEST 1 VIEW)

[view not recorded (1 of 4)]
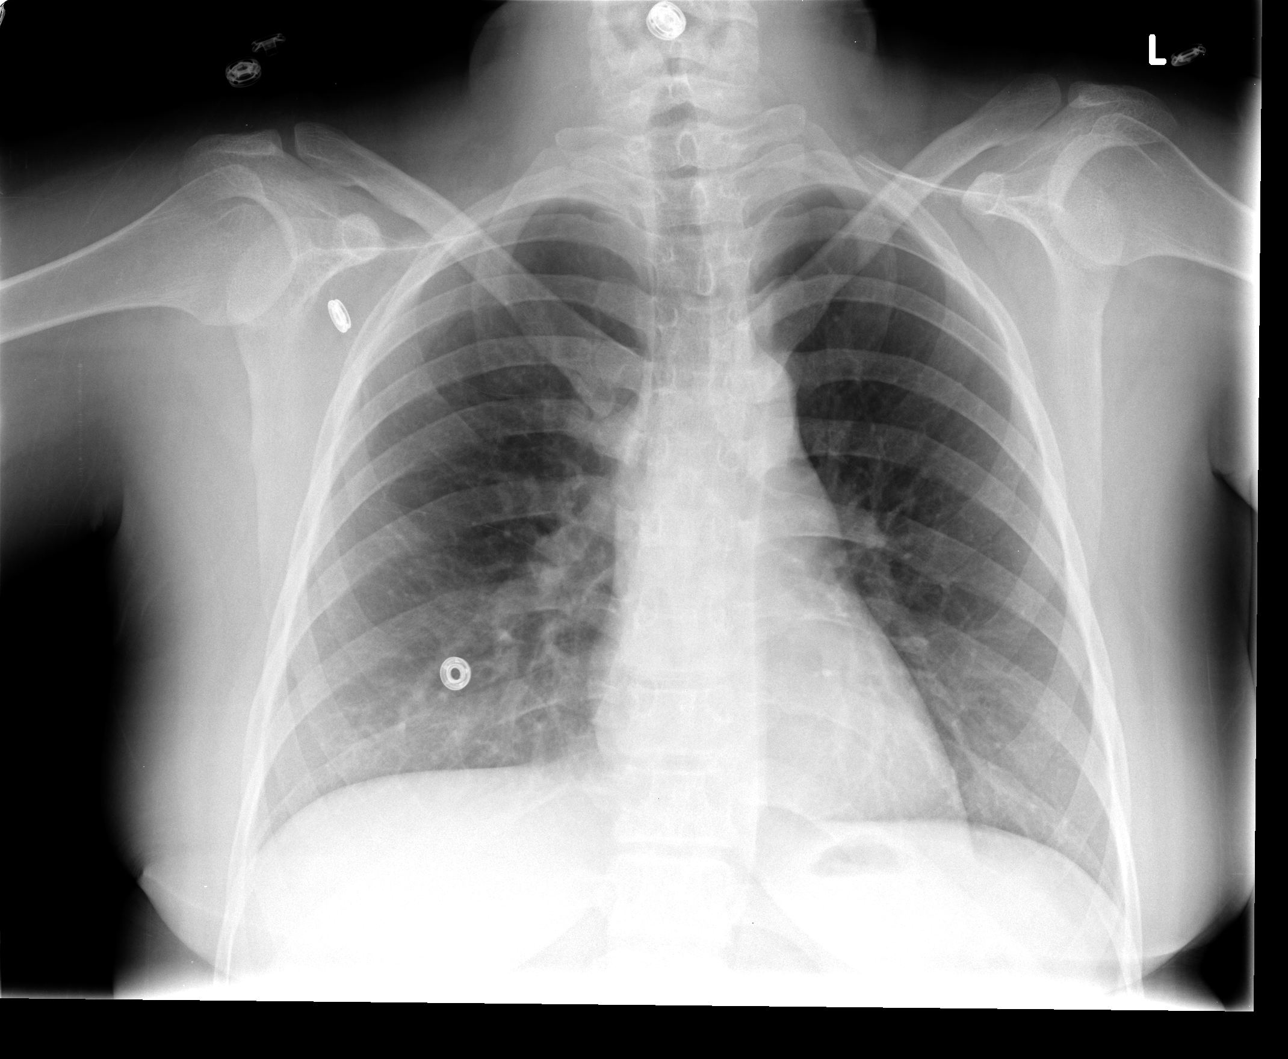

[view not recorded (2 of 4)]
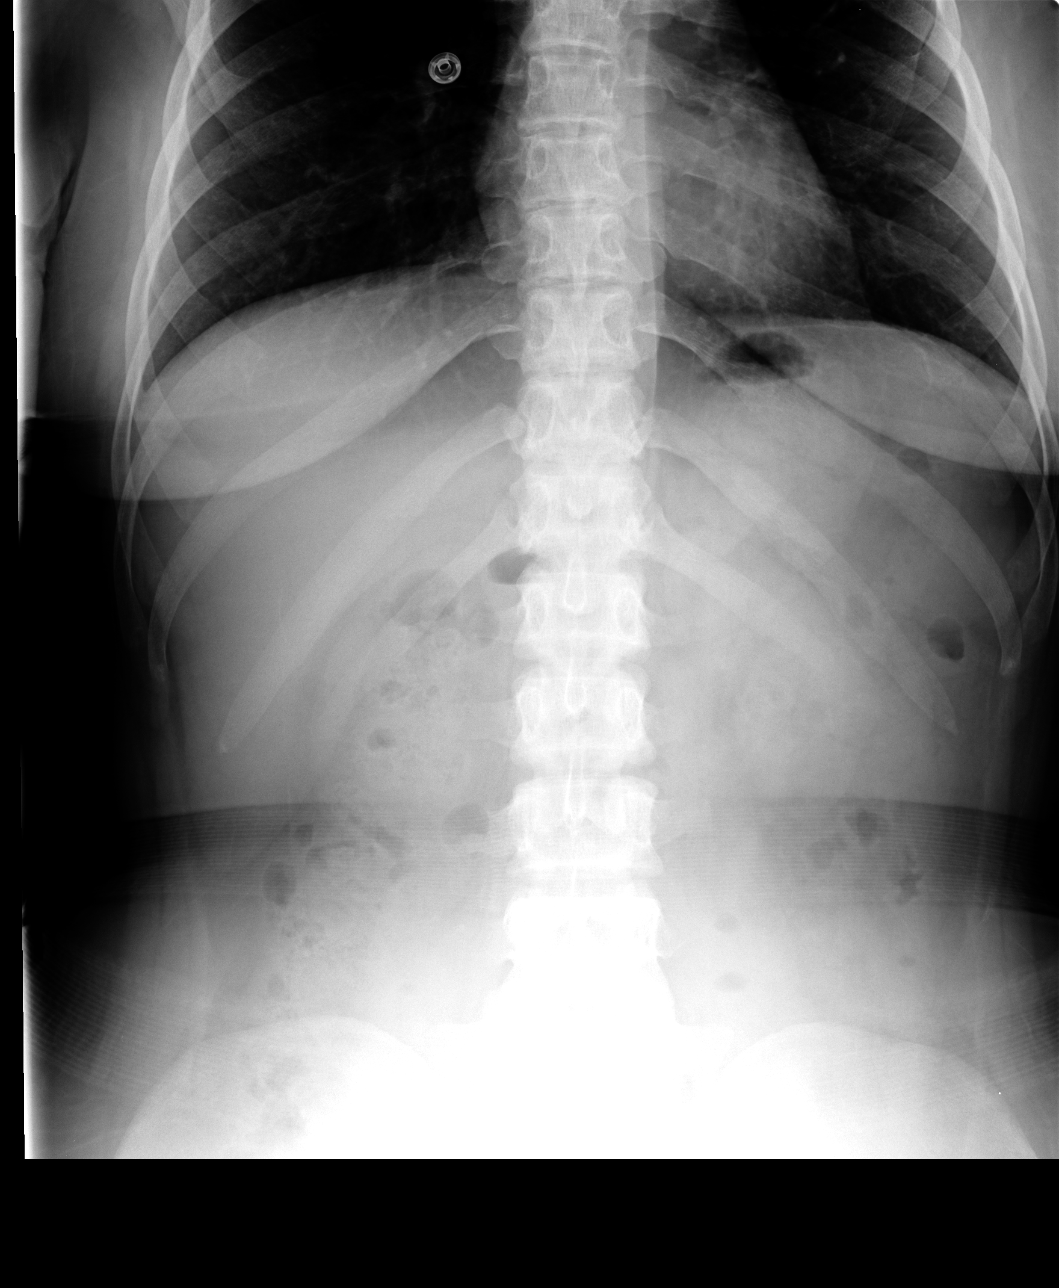

[view not recorded (3 of 4)]
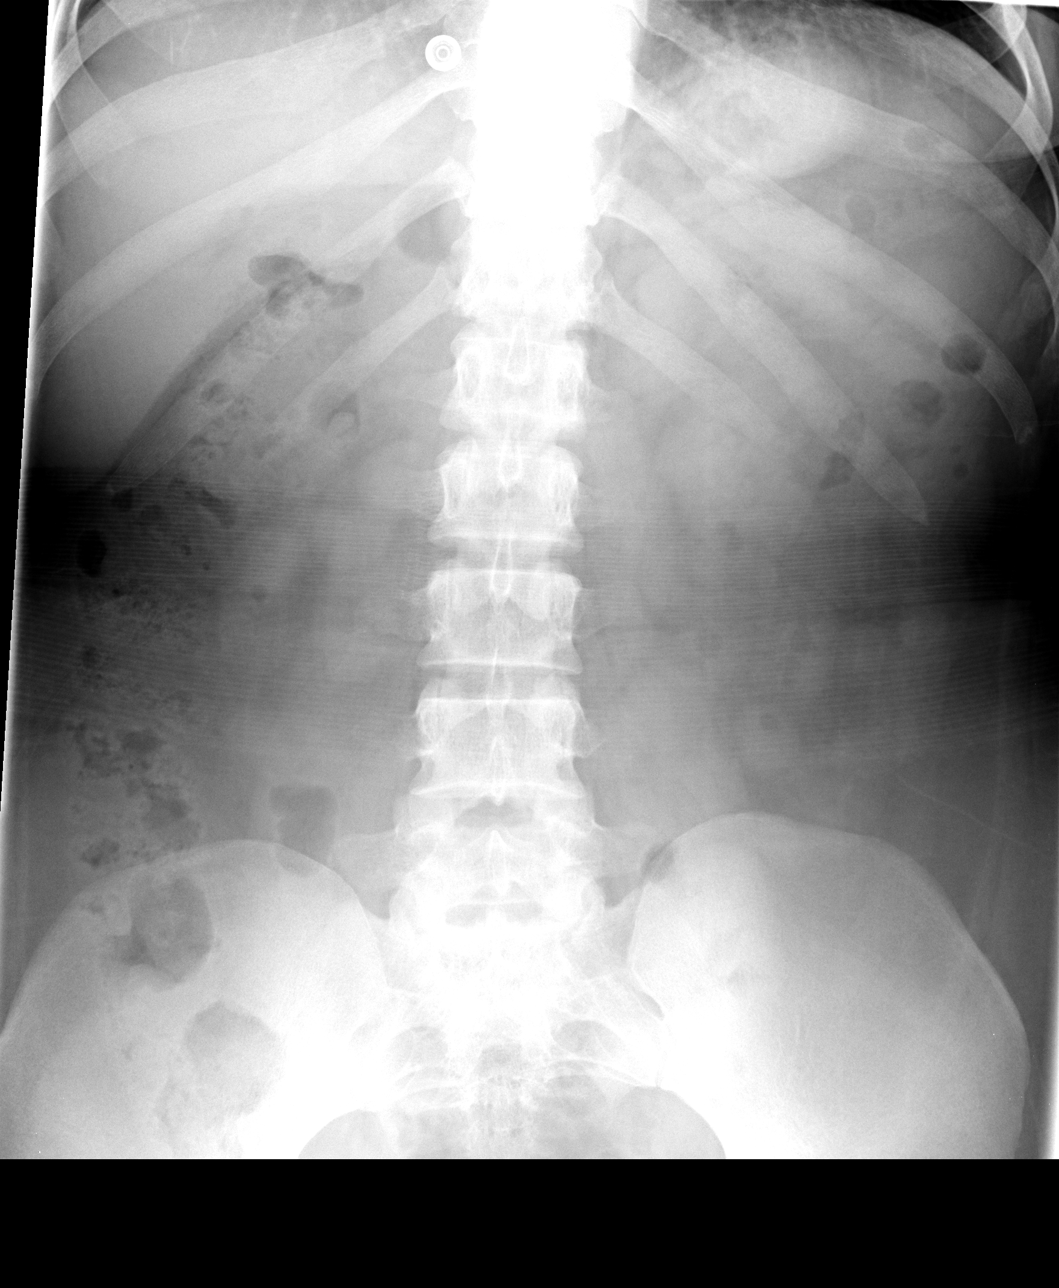

[view not recorded (4 of 4)]
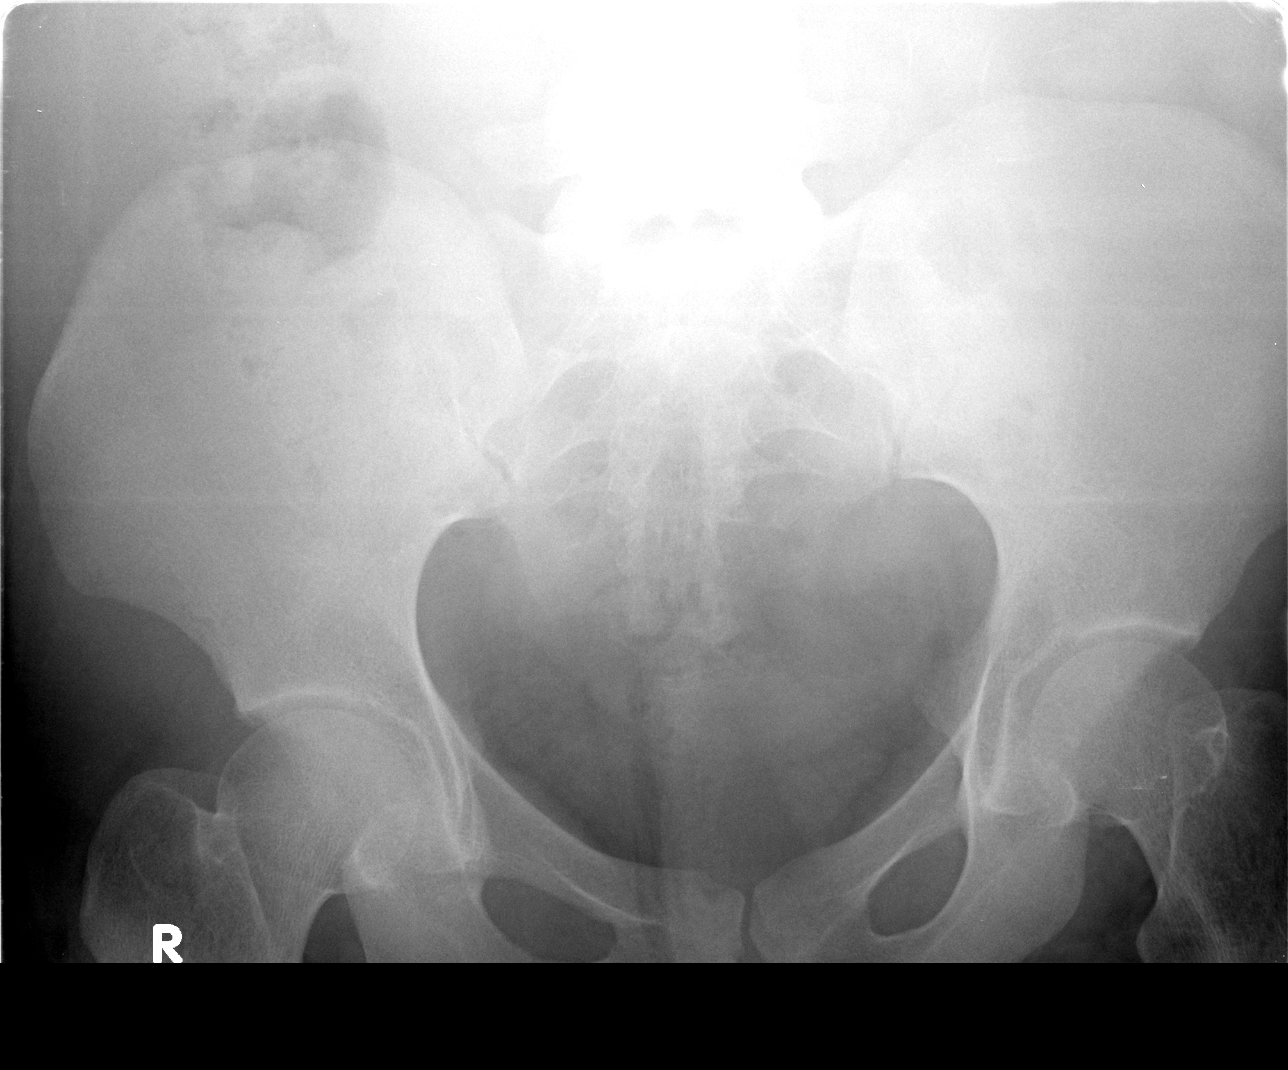

[4 of 4 positions shown; findings below may reference images not displayed]

FINDINGS: Lungs are clear.  Cardiomediastinal contours are within
normal limits.

Nonobstructive bowel gas pattern.  Organ outlines normal where
seen.  No free intraperitoneal air.  No acute osseous finding.
IMPRESSION: Nonobstructive bowel gas pattern.

## 2015-09-26 ENCOUNTER — Emergency Department (HOSPITAL_COMMUNITY): Payer: Commercial Managed Care - HMO

## 2015-09-26 ENCOUNTER — Encounter (HOSPITAL_COMMUNITY): Payer: Self-pay

## 2015-09-26 ENCOUNTER — Emergency Department (HOSPITAL_COMMUNITY)
Admission: EM | Admit: 2015-09-26 | Discharge: 2015-09-26 | Disposition: A | Discharge status: Home / Self Care | Payer: Commercial Managed Care - HMO | Attending: Emergency Medicine | Admitting: Emergency Medicine

## 2015-09-26 DIAGNOSIS — Y998 Other external cause status: Secondary | ICD-10-CM | POA: Diagnosis not present

## 2015-09-26 DIAGNOSIS — S99921A Unspecified injury of right foot, initial encounter: Secondary | ICD-10-CM | POA: Diagnosis present

## 2015-09-26 DIAGNOSIS — Z8659 Personal history of other mental and behavioral disorders: Secondary | ICD-10-CM | POA: Diagnosis not present

## 2015-09-26 DIAGNOSIS — S92531A Displaced fracture of distal phalanx of right lesser toe(s), initial encounter for closed fracture: Secondary | ICD-10-CM | POA: Insufficient documentation

## 2015-09-26 DIAGNOSIS — S92301A Fracture of unspecified metatarsal bone(s), right foot, initial encounter for closed fracture: Secondary | ICD-10-CM

## 2015-09-26 DIAGNOSIS — Y9289 Other specified places as the place of occurrence of the external cause: Secondary | ICD-10-CM | POA: Insufficient documentation

## 2015-09-26 DIAGNOSIS — Y9389 Activity, other specified: Secondary | ICD-10-CM | POA: Diagnosis not present

## 2015-09-26 DIAGNOSIS — W1839XA Other fall on same level, initial encounter: Secondary | ICD-10-CM | POA: Insufficient documentation

## 2015-09-26 HISTORY — DX: Unspecified intellectual disabilities: F79

## 2015-09-26 MED ORDER — HYDROCODONE-ACETAMINOPHEN 7.5-325 MG/15ML PO SOLN: 10.0000 mL | Freq: Four times a day (QID) | ORAL | Status: DC | PRN

## 2015-09-26 MED ORDER — HYDROCODONE-ACETAMINOPHEN 5-325 MG PO TABS: 1.0000 | ORAL_TABLET | ORAL | Status: DC | PRN

## 2015-09-26 MED ORDER — IBUPROFEN 100 MG/5ML PO SUSP
400.0000 mg | Freq: Once | ORAL | Status: AC
  Administered 2015-09-26: 400 mg via ORAL
  Filled 2015-09-26: qty 20

## 2015-09-26 NOTE — ED Notes (Signed)
Father reports pt fell yesterday and c/o pain to r foot.  No obvious bruising or deformity.

## 2015-09-26 NOTE — Discharge Instructions (Signed)
Metatarsal Fracture A metatarsal fracture is a break in a metatarsal bone. Metatarsal bones connect your toe bones to your ankle bones. CAUSES This type of fracture may be caused by:  A sudden twisting of your foot.  A fall onto your foot.  Overuse or repetitive exercise. RISK FACTORS This condition is more likely to develop in people who:  Play contact sports.  Have a bone disease.  Have a low calcium level. SYMPTOMS Symptoms of this condition include:  Pain that is worse when walking or standing.  Pain when pressing on the foot or moving the toes.  Swelling.  Bruising on the top or bottom of the foot.  A foot that appears shorter than the other one. DIAGNOSIS This condition is diagnosed with a physical exam. You may also have imaging tests, such as:  X-rays.  A CT scan.  MRI. TREATMENT Treatment for this condition depends on its severity and whether a bone has moved out of place. Treatment may involve:  Rest.  Wearing foot support such as a cast, splint, or boot for several weeks.  Using crutches.  Surgery to move bones back into the right position. Surgery is usually needed if there are many pieces of broken bone or bones that are very out of place (displaced fracture).  Physical therapy. This may be needed to help you regain full movement and strength in your foot. You will need to return to your health care provider to have X-rays taken until your bones heal. Your health care provider will look at the X-rays to make sure that your foot is healing well. HOME CARE INSTRUCTIONS  If You Have a Cast:  Do not stick anything inside the cast to scratch your skin. Doing that increases your risk of infection.  Check the skin around the cast every day. Report any concerns to your health care provider. You may put lotion on dry skin around the edges of the cast. Do not apply lotion to the skin underneath the cast.  Keep the cast clean and dry. If You Have a Splint  or a Supportive Boot:  Wear it as directed by your health care provider. Remove it only as directed by your health care provider.  Loosen it if your toes become numb and tingle, or if they turn cold and blue.  Keep it clean and dry. Bathing  Do not take baths, swim, or use a hot tub until your health care provider approves. Ask your health care provider if you can take showers. You may only be allowed to take sponge baths for bathing.  If your health care provider approves bathing and showering, cover the cast or splint with a watertight plastic bag to protect it from water. Do not let the cast or splint get wet. Managing Pain, Stiffness, and Swelling  If directed, apply ice to the injured area (if you have a splint, not a cast).  Put ice in a plastic bag.  Place a towel between your skin and the bag.  Leave the ice on for 20 minutes, 2-3 times per day.  Move your toes often to avoid stiffness and to lessen swelling.  Raise (elevate) the injured area above the level of your heart while you are sitting or lying down. Driving  Do not drive or operate heavy machinery while taking pain medicine.  Do not drive while wearing foot support on a foot that you use for driving. Activity  Return to your normal activities as directed by your health care   provider. Ask your health care provider what activities are safe for you.  Perform exercises as directed by your health care provider or physical therapist. Safety  Do not use the injured foot to support your body weight until your health care provider says that you can. Use crutches as directed by your health care provider. General Instructions  Do not put pressure on any part of the cast or splint until it is fully hardened. This may take several hours.  Do not use any tobacco products, including cigarettes, chewing tobacco, or e-cigarettes. Tobacco can delay bone healing. If you need help quitting, ask your health care  provider.  Take medicines only as directed by your health care provider.  Keep all follow-up visits as directed by your health care provider. This is important. SEEK MEDICAL CARE IF:  You have a fever.  Your cast, splint, or boot is too loose or too tight.  Your cast, splint, or boot is damaged.  Your pain medicine is not helping.  You have pain, tingling, or numbness in your foot that is not going away. SEEK IMMEDIATE MEDICAL CARE IF:  You have severe pain.  You have tingling or numbness in your foot that is getting worse.  Your foot feels cold or becomes numb.  Your foot changes color.   This information is not intended to replace advice given to you by your health care provider. Make sure you discuss any questions you have with your health care provider.   Document Released: 05/15/2002 Document Revised: 01/07/2015 Document Reviewed: 06/19/2014 Elsevier Interactive Patient Education 2016 [employer]  

## 2015-09-27 NOTE — ED Provider Notes (Signed)
CSN: [id number]     Arrival date & time 09/26/15  1223 History   First MD Initiated Contact with Patient 09/26/15 1301     Chief Complaint  Patient presents with  . Fall     (Consider location/radiation/quality/duration/timing/severity/associated sxs/prior Treatment) The history is provided by the patient and a parent. The history is limited by a developmental delay.   Karina Rogers is a 30 y.o. female with a history of MR, presenting for evaluation of persistent right foot pain and swelling since falling yesterday.  Father reports she has a habit of sitting with her right foot tucked behind her left knee.  She stood up yesterday and fell as the right foot was "asleep".  She was ambulatory after the event, although endorsed pain.  Today she has refused to stand on the foot and has increased swelling.  She denies any other areas of pain.  She has had no medicines prior to arrival.        Past Medical History  Diagnosis Date  . Mental disorder   . Mental retardation    Past Surgical History  Procedure Laterality Date  . Cosmetic surgery      growth removed from right side of face  . Cholecystectomy  02/04/2012    Procedure: LAPAROSCOPIC CHOLECYSTECTOMY;  Surgeon: Dalia Heading, MD;  Location: [facility];  Service: General;  Laterality: N/A;   Family History  Problem Relation Age of Onset  . Anesthesia problems Neg Hx   . Hypotension Neg Hx   . Malignant hyperthermia Neg Hx   . Pseudochol deficiency Neg Hx    Social History  Substance Use Topics  . Smoking status: Never Smoker   . Smokeless tobacco: None  . Alcohol Use: No   OB History    No data available     Review of Systems  Constitutional: Negative for fever.  Musculoskeletal: Positive for joint swelling and arthralgias. Negative for myalgias.  Neurological: Negative for weakness and numbness.      Allergies  Contrast media  Home Medications   Prior to Admission medications   Medication Sig Start Date End  Date Taking? Authorizing Provider  HYDROcodone-acetaminophen (HYCET) 7.5-325 mg/15 ml solution Take 10 mLs by mouth 4 (four) times daily as needed for moderate pain. 09/26/15 09/25/16  Burgess Amor, PA-C   BP 151/74 mmHg  Pulse 120  Temp(Src) 98 F (36.7 C) (Tympanic)  Resp 18  Ht  (1.676 m)  Wt 68.04 kg  BMI 24.22 kg/m2  SpO2 100%  LMP 09/19/2015 Physical Exam  Musculoskeletal:       Right foot: There is bony tenderness and swelling. There is normal capillary refill and no deformity.  ttp with edema and early bruising right dorsal distal foot.  She can move her toes, normal sensation in the toes with less than 2 sec cap refill.  Dorsalis pedal pulse normal.  No ankle, lower leg or knee pain.    ED Course  Procedures (including critical care time) Labs Review Labs Reviewed - No data to display  Imaging Review Dg Foot Complete Right  09/26/2015  CLINICAL DATA:  Pain and swelling EXAM: RIGHT FOOT COMPLETE - 3+ VIEW COMPARISON:  None. FINDINGS: Frontal, oblique, and lateral views were obtained. There are fractures of the distal second, third, and fourth metatarsal metaphysis ease with alignment near anatomic. No other fractures. No dislocation. Joint spaces appear intact. There is soft tissue swelling dorsally in the area of the metatarsals. No erosive change. IMPRESSION: Fractures  of the distal second, third, and fourth metatarsals with alignment near anatomic. Overlying dorsal soft tissue swelling. No dislocation. No appreciable arthropathic change. Electronically Signed   By: Bretta Bang III M.D.   On: 09/26/2015 14:10   I have personally reviewed and evaluated these images and lab results as part of my medical decision-making.   EKG Interpretation None      MDM   Final diagnoses:  Fracture of metatarsal of right foot, closed, initial encounter      Radiological studies were viewed, interpreted and considered during the medical decision making and disposition process.  I agree with radiologists reading.  Results were also discussed with patient. Pt was placed in cam walker.  She is not a candidate for crutches, she has a walker with seat at home which she has used this morning and can continue using to avoid weight bearing.  Elevation, ice, ibuprofen.  hycet prescribed if needed for increased pain relief (cannot swallow tabs).  Referral to Dr. Romeo Apple, father to call for appt time early next week.  In the interim, return here for any probs or concerns.     Burgess Amor, PA-C 09/27/15 [id number]  Marily Memos, MD 09/28/15 [phone]

## 2015-10-02 ENCOUNTER — Ambulatory Visit (INDEPENDENT_AMBULATORY_CARE_PROVIDER_SITE_OTHER): Payer: Medicaid Other | Admitting: Orthopedic Surgery

## 2015-10-02 VITALS — BP 115/72 | Ht 66.0 in | Wt 150.0 lb

## 2015-10-02 DIAGNOSIS — S92301A Fracture of unspecified metatarsal bone(s), right foot, initial encounter for closed fracture: Secondary | ICD-10-CM

## 2015-10-02 NOTE — Progress Notes (Signed)
Patient ID: Karina Rogers, female   DOB: 09/25/1985, 30 y.o.   MRN: [id number]  Chief Complaint  Patient presents with  . Follow-up    er follow up Rt foot fx (toes)  DOI 09/26/15    HPI Karina Rogers is a 30 y.o. female.  Fell 6 days ago at home c/o right foot pain  Pain right foot Dulle, ache Mild constant  Review of Systems Review of Systems  Swelling Neuro normal   Past Medical History  Diagnosis Date  . Mental disorder   . Mental retardation     Past Surgical History  Procedure Laterality Date  . Cosmetic surgery      growth removed from right side of face  . Cholecystectomy  02/04/2012    Procedure: LAPAROSCOPIC CHOLECYSTECTOMY;  Surgeon: Dalia Heading, MD;  Location: [facility];  Service: General;  Laterality: N/A;    Family History  Problem Relation Age of Onset  . Anesthesia problems Neg Hx   . Hypotension Neg Hx   . Malignant hyperthermia Neg Hx   . Pseudochol deficiency Neg Hx     Social History Social History  Substance Use Topics  . Smoking status: Never Smoker   . Smokeless tobacco: Not on file  . Alcohol Use: No    Allergies  Allergen Reactions  . Contrast Media [Iodinated Diagnostic Agents] Anaphylaxis    No current outpatient prescriptions on file.   No current facility-administered medications for this visit.       Physical Exam Physical Exam Blood pressure 115/72, height  (1.676 m), weight 150 lb (68.04 kg), last menstrual period 09/19/2015. Appearance, there are no abnormalities in terms of appearance the patient was well-developed and well-nourished. The grooming and hygiene were normal.  Mental status orientation, there was normal alertness and orientation Mood pleasant Ambulatory status abnormal with cam walker and rolling walker  Examination of the right foot Inspection swelling tenderness right foot 4 / 5 digits, alignment normal  Range of motion normal but pain at mtp joints  Tests for stability deferred pain   Motor strength  No atrophy Skin warm dry and intact without laceration or ulceration or erythema Neurologic examination normal sensation Vascular examination normal pulses with warm extremity and normal capillary refill  The opposite extremity normal alignment no swelling     Data Reviewed [facility]X RAY I SEE  FRACTURES OF 2,3,4 MTT  Assessment  2,3,4 metatarsal frx    Plan  Cam walker weight bearing as tolerated   xrays right foot in 6 weeks

## 2015-11-17 ENCOUNTER — Ambulatory Visit (INDEPENDENT_AMBULATORY_CARE_PROVIDER_SITE_OTHER): Payer: Commercial Managed Care - HMO | Admitting: Orthopedic Surgery

## 2015-11-17 ENCOUNTER — Ambulatory Visit (INDEPENDENT_AMBULATORY_CARE_PROVIDER_SITE_OTHER): Payer: Commercial Managed Care - HMO

## 2015-11-17 VITALS — BP 118/77 | Ht 66.0 in | Wt 150.0 lb

## 2015-11-17 DIAGNOSIS — S92301D Fracture of unspecified metatarsal bone(s), right foot, subsequent encounter for fracture with routine healing: Secondary | ICD-10-CM | POA: Diagnosis not present

## 2015-11-17 NOTE — Progress Notes (Signed)
Patient ID: Charolett BumpersStephanie L Chagnon, female   DOB: Mar 13, 1986, 30 y.o.   MRN: [id number]  Chief Complaint  Patient presents with  . Follow-up    6 week recheck on right foot fracture with xray, DOI 09-26-15.    HPI multiple metatarsal fractures right foot proximally 6 weeks ago treated with Cam Walker  ROS noncontributory  BP 118/77 mmHg  Ht 5\' 6"  (1.676 m)  Wt 150 lb (68.04 kg)  BMI 24.22 kg/m2  No tenderness foot alignment is normal  X-rays show fracture healing    ASSESSMENT AND PLAN   Remove boot fractures have healed. Return as needed

## 2015-12-30 ENCOUNTER — Ambulatory Visit: Payer: Commercial Managed Care - HMO

## 2017-03-05 IMAGING — DX DG FOOT COMPLETE 3+V*R*
3 series · 3 of 3 positions shown · non-contrast
Comparison: None.

CLINICAL DATA: Pain and swelling

EXAM:
RIGHT FOOT COMPLETE - 3+ VIEW

[foot ap]
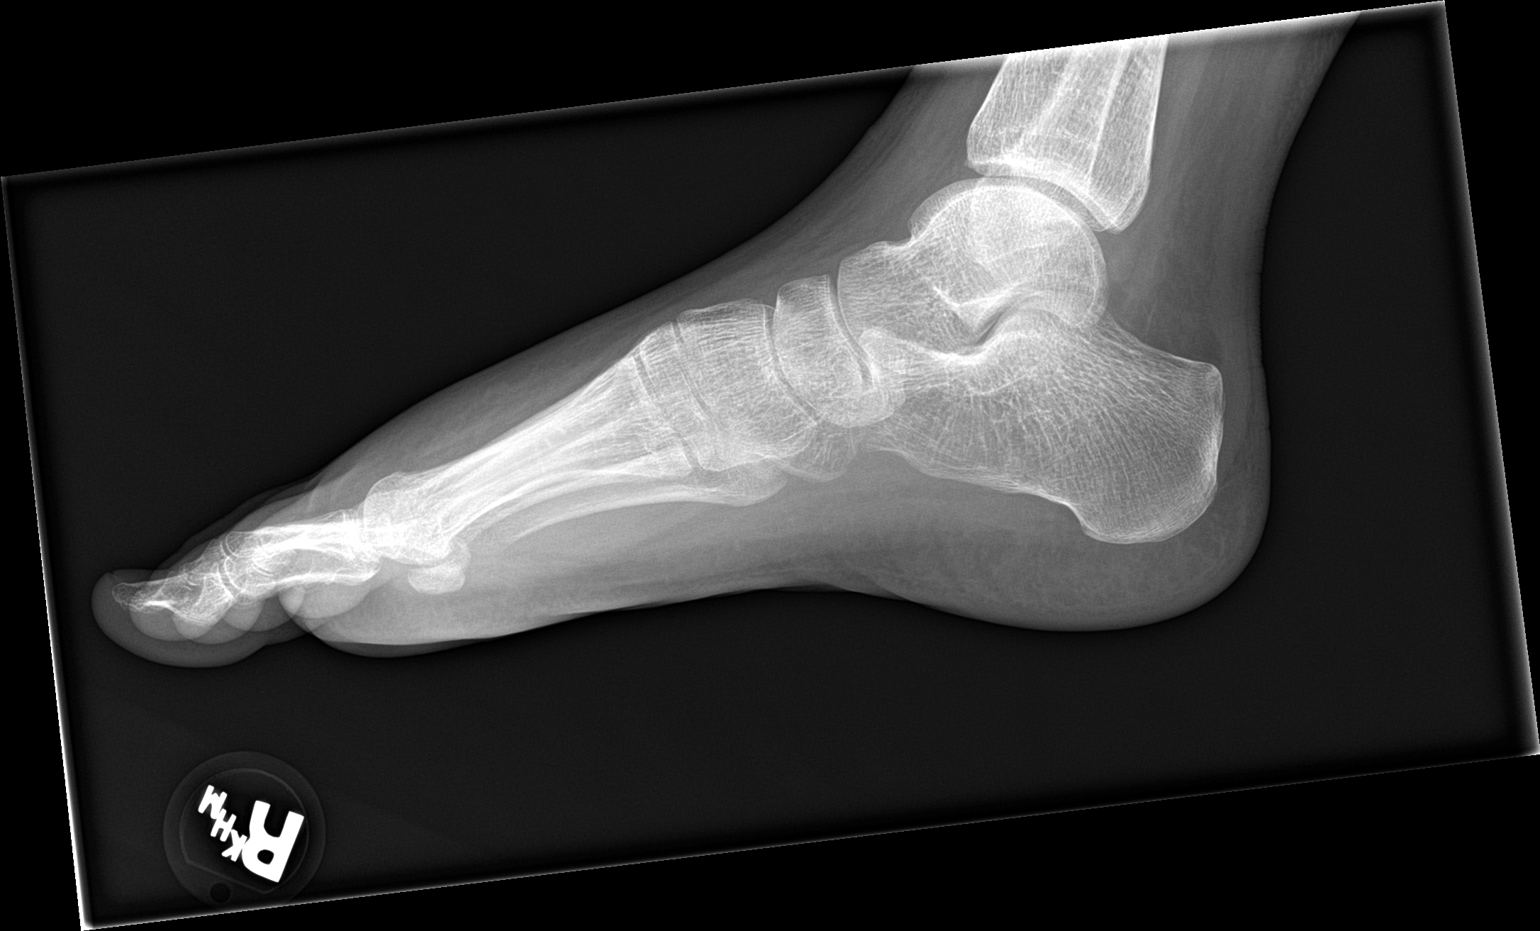

[foot obl]
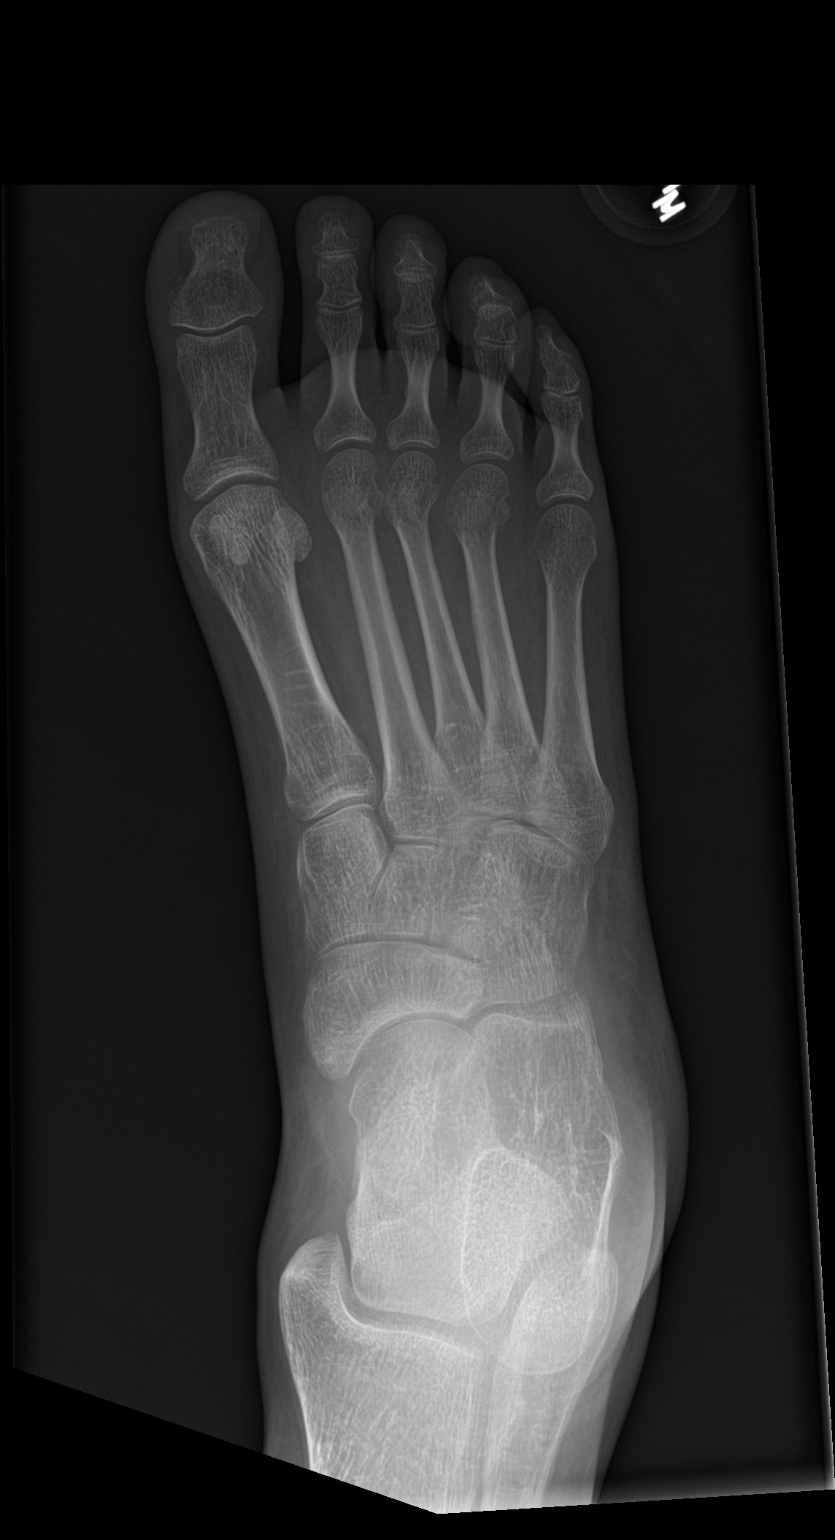

[foot lat]
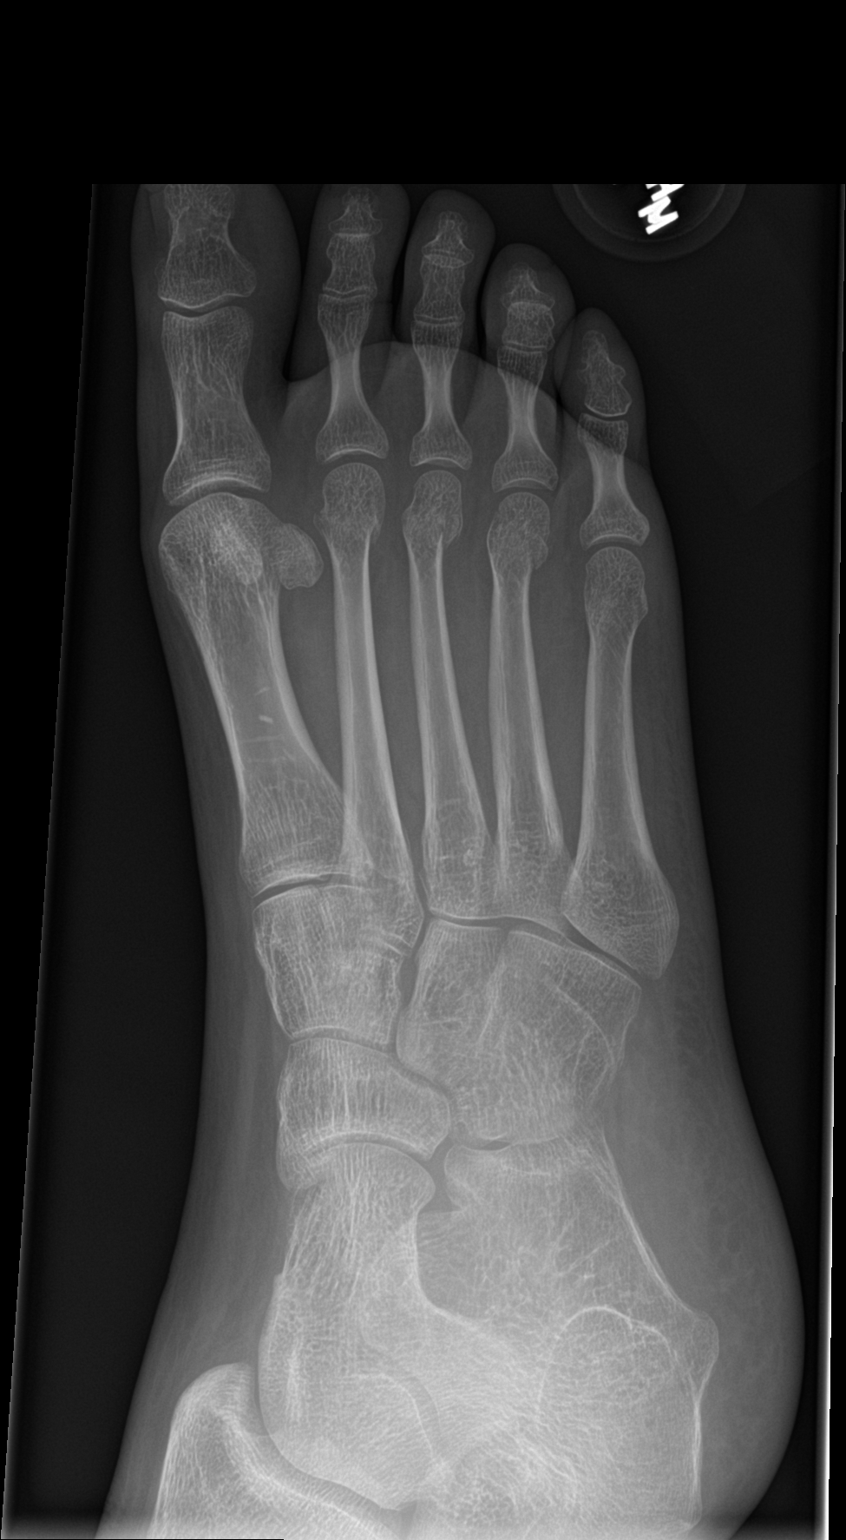

[3 of 3 positions shown; findings below may reference images not displayed]

FINDINGS: Frontal, oblique, and lateral views were obtained. There are
fractures of the distal second, third, and fourth metatarsal
metaphysis ease with alignment near anatomic. No other fractures. No
dislocation. Joint spaces appear intact. There is soft tissue
swelling dorsally in the area of the metatarsals. No erosive change.
IMPRESSION: Fractures of the distal second, third, and fourth metatarsals with
alignment near anatomic. Overlying dorsal soft tissue swelling. No
dislocation. No appreciable arthropathic change.
